# Patient Record
Sex: Male | Born: 2000
Health system: Southern US, Community
[De-identification: ages and names within clinical notes are randomized; demographics above are authoritative.]

## PROBLEM LIST (undated history)

## (undated) DIAGNOSIS — S83519A Sprain of anterior cruciate ligament of unspecified knee, initial encounter: Secondary | ICD-10-CM

## (undated) DIAGNOSIS — F411 Generalized anxiety disorder: Secondary | ICD-10-CM

## (undated) HISTORY — PX: TYMPANOSTOMY TUBE PLACEMENT: SHX32

## (undated) HISTORY — DX: Generalized anxiety disorder: F41.1

## (undated) HISTORY — PX: TEAR DUCT PROBING: SHX793

---
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
## 2018-04-18 DIAGNOSIS — Z113 Encounter for screening for infections with a predominantly sexual mode of transmission: Secondary | ICD-10-CM | POA: Diagnosis not present

## 2018-04-18 DIAGNOSIS — R369 Urethral discharge, unspecified: Secondary | ICD-10-CM | POA: Diagnosis not present

## 2018-04-18 DIAGNOSIS — Z114 Encounter for screening for human immunodeficiency virus [HIV]: Secondary | ICD-10-CM | POA: Diagnosis not present

## 2018-04-25 MED FILL — AZITHROMYCIN 500 MG TABLET: 500 | 1 days supply | Qty: 2 | Fill #0

## 2018-07-10 ENCOUNTER — Encounter: Payer: Self-pay | Admitting: Primary Care

## 2018-07-10 ENCOUNTER — Ambulatory Visit: Payer: BLUE CROSS/BLUE SHIELD | Admitting: Primary Care

## 2018-07-10 ENCOUNTER — Other Ambulatory Visit: Payer: Self-pay

## 2018-07-10 VITALS — BP 122/76 | HR 72 | Temp 98.4°F | Ht 69.0 in | Wt 129.0 lb

## 2018-07-10 DIAGNOSIS — F411 Generalized anxiety disorder: Secondary | ICD-10-CM | POA: Diagnosis not present

## 2018-07-10 DIAGNOSIS — Z8349 Family history of other endocrine, nutritional and metabolic diseases: Secondary | ICD-10-CM | POA: Diagnosis not present

## 2018-07-10 MED ORDER — ESCITALOPRAM OXALATE 10 MG PO TABS: 10.0000 mg | ORAL_TABLET | Freq: Every day | ORAL | 0 refills | Status: DC

## 2018-07-10 NOTE — Progress Notes (Signed)
Subjective:    Patient ID: Kristopher White, male    DOB: 05-23-2000, 18 y.o.   MRN: [id number]  HPI  Kristopher White is a 18 year old male who presents today to establish care and discuss the problems mentioned below. Will obtain/review records.  1) GAD: Originally diagnosed about month ago per the [facility] health [facility], but has struggled with anxiety for years. He was initiated on Lexapro 10 mg about 2-3 weeks ago and has since noticed a slight improvement in mood. No significant difference. He denies GI upset, nausea, SI/HI. He will run out of his Lexapro in 15 days and is needing refills.   2) Family History of Graves Disease: Present in his mother who was diagnosed at age 29. Following with endocrinology and was tested last month. He believes his thyroid function was on the "lower end of normal". He has a follow up visit scheduled for May 2020 at [facility]. He is not on medication. He denies heat intolerance, palpitations, chest pain, dizziness.   Review of Systems  Constitutional: Negative for unexpected weight change.  Respiratory: Negative for shortness of breath.   Cardiovascular: Negative for palpitations.  Endocrine: Negative for heat intolerance.  Psychiatric/Behavioral: Negative for suicidal ideas.       See HPI       Past Medical History:  Diagnosis Date  . ACL tear 07/02/2014   left  . GAD (generalized anxiety disorder)      Social History   Socioeconomic History  . Marital status: Single    Spouse name: Not on file  . Number of children: Not on file  . Years of education: Not on file  . Highest education level: Not on file  Occupational History  . Not on file  Social Needs  . Financial resource strain: Not on file  . Food insecurity:    Worry: Not on file    Inability: Not on file  . Transportation needs:    Medical: Not on file    Non-medical: Not on file  Tobacco Use  . Smoking status: Never Smoker  . Smokeless tobacco: Never Used  . Tobacco comment: Vaping   Substance and Sexual Activity  . Alcohol use: No  . Drug use: No  . Sexual activity: Not on file  Lifestyle  . Physical activity:    Days per week: Not on file    Minutes per session: Not on file  . Stress: Not on file  Relationships  . Social connections:    Talks on phone: Not on file    Gets together: Not on file    Attends religious service: Not on file    Active member of club or organization: Not on file    Attends meetings of clubs or organizations: Not on file    Relationship status: Not on file  . Intimate partner violence:    Fear of current or ex partner: Not on file    Emotionally abused: Not on file    Physically abused: Not on file    Forced sexual activity: Not on file  Other Topics Concern  . Not on file  Social History Narrative   Student at [employer].   Single.   Aspires to be a Best boy.    Enjoys playing lacrosse, swimming.     Past Surgical History:  Procedure Laterality Date  . KNEE ARTHROSCOPY WITH ANTERIOR CRUCIATE LIGAMENT (ACL) REPAIR Left 07/19/2014   Procedure: KNEE ARTHROSCOPY WITH ANTERIOR CRUCIATE LIGAMENT (ACL) REPAIR AUTOGRAFT;  Surgeon: Dannielle Huh, MD;  Location: [facility];  Service: Orthopedics;  Laterality: Left;  . TEAR DUCT PROBING    . TYMPANOSTOMY TUBE PLACEMENT      Family History  Problem Relation Age of Onset  . Hypertension Paternal Grandmother   . Melanoma Paternal Grandmother   . Graves' disease Mother   . Prostate cancer Paternal Grandfather   . Addison's disease Maternal Aunt     No Known Allergies  No current outpatient medications on file prior to visit.   No current facility-administered medications on file prior to visit.     BP 122/76   Pulse 72   Temp 98.4 F (36.9 C) (Oral)   Ht 5\' 9"  (1.753 m)   Wt 129 lb (58.5 kg)   SpO2 98%   BMI 19.05 kg/m    Objective:   Physical Exam  Constitutional: He appears well-nourished.  Neck: Neck supple.  Cardiovascular: Normal rate and regular  rhythm.  Respiratory: Effort normal and breath sounds normal.  Skin: Skin is warm and dry.  Psychiatric: He has a normal mood and affect.           Assessment & Plan:

## 2018-07-10 NOTE — Assessment & Plan Note (Addendum)
Chronic, initiated on treatment 2-3 weeks ago per [facility]. Refill provided for Lexapro 10 mg today. We will plan to see him back in 1 month for follow up once medication has had a longer time to take complete effect. He denies SI/HI.

## 2018-07-10 NOTE — Patient Instructions (Signed)
Continue escitalopram (Lexapro) 10 mg tablets for anxiety.   Please notify me if your appointment in May is cancelled with the endocrinologist.   Please schedule a follow up appointment in 1 month for re-evaluation of anxiety.  It was a pleasure to meet you today! Please don't hesitate to call or message me with any questions. Welcome to [facility]!

## 2018-07-10 NOTE — Assessment & Plan Note (Signed)
In mother.  Discussed for patient to notify me if his appointment with endocrinology is cancelled in May. If this is the case then we will check labs. Will obtain records in the mean time.

## 2018-07-13 MED FILL — ESCITALOPRAM 10 MG TABLET: 10 | 90 days supply | Qty: 90 | Fill #0

## 2018-08-13 ENCOUNTER — Ambulatory Visit (INDEPENDENT_AMBULATORY_CARE_PROVIDER_SITE_OTHER): Payer: BLUE CROSS/BLUE SHIELD | Admitting: Primary Care

## 2018-08-13 DIAGNOSIS — F411 Generalized anxiety disorder: Secondary | ICD-10-CM | POA: Diagnosis not present

## 2018-08-13 NOTE — Progress Notes (Signed)
Subjective:    Patient ID: Kristopher White, male    DOB: [birth year], 18 y.o.   MRN: [id number]  HPI  Virtual Visit via Video Note  I connected with Kristopher White on 08/13/18 at  9:20 AM EDT by a video enabled telemedicine application and verified that I am speaking with the correct person using two identifiers.   I discussed the limitations of evaluation and management by telemedicine and the availability of in person appointments. The patient expressed understanding and agreed to proceed. He is at home, I am in the office.  History of Present Illness:  Kristopher White is a 18 year old male who presents today for follow up of anxiety.  He was last evaluated on 07/10/18 as a new patient who was placed on Lexapro 10 mg 2-3 weeks ago by the student health center at [facility]. The university shut down due to Covid-19 so patient presented to [place] for refills. We continued his Lexapro 10 mg as he had only been taking for 2-3 weeks. He did endorse at that time that he hadn't noticed a significant improvement.  Since his last visit he's doing better. His appetite is still small overall but he's sleeping better. He's also noticed that he's not as upset with things that anger him, less irritability overall. He continues to worry about things but this has overall improved. He denies SI/HI.   Observations/Objective:  Mood appropriate, good eye contact, smiling. No distress. Speaking in complete sentences.  Assessment and Plan:  See problem based charting  Follow Up Instructions:  Continue Lexapro 10 mg tablets once daily for anxiety.   Have your pharmacy notify me when you need refills.  It was a pleasure to see you today! Kristopher White    I discussed the assessment and treatment plan with the patient. The patient was provided an opportunity to ask questions and all were answered. The patient agreed with the plan and demonstrated an understanding of the instructions.   The patient was advised to call  back or seek an in-person evaluation if the symptoms worsen or if the condition fails to improve as anticipated.    Kristopher White    Review of Systems  Respiratory: Negative for shortness of breath.   Cardiovascular: Negative for chest pain.  Psychiatric/Behavioral: Negative for sleep disturbance and suicidal ideas.       See HPI        Past Medical History:  Diagnosis Date  . ACL tear 07/02/2014   left  . GAD (generalized anxiety disorder)      Social History   Socioeconomic History  . Marital status: Single    Spouse name: Not on file  . Number of children: Not on file  . Years of education: Not on file  . Highest education level: Not on file  Occupational History  . Not on file  Social Needs  . Financial resource strain: Not on file  . Food insecurity:    Worry: Not on file    Inability: Not on file  . Transportation needs:    Medical: Not on file    Non-medical: Not on file  Tobacco Use  . Smoking status: Never Smoker  . Smokeless tobacco: Never Used  . Tobacco comment: Vaping  Substance and Sexual Activity  . Alcohol use: No  . Drug use: No  . Sexual activity: Not on file  Lifestyle  . Physical activity:    Days per week: Not on file  Minutes per session: Not on file  . Stress: Not on file  Relationships  . Social connections:    Talks on phone: Not on file    Gets together: Not on file    Attends religious service: Not on file    Active member of club or organization: Not on file    Attends meetings of clubs or organizations: Not on file    Relationship status: Not on file  . Intimate partner violence:    Fear of current or ex partner: Not on file    Emotionally abused: Not on file    Physically abused: Not on file    Forced sexual activity: Not on file  Other Topics Concern  . Not on file  Social History Narrative   Student at [employer].   Single.   Aspires to be a [employer] boys[employer] engineer.    Enjoys playing lacrosse, swimming.     Past  Surgical History:  Procedure Laterality Date  . KNEE ARTHROSCOPY WITH ANTERIOR CRUCIATE LIGAMENT (ACL) REPAIR Left 07/19/2014   Procedure: KNEE ARTHROSCOPY WITH ANTERIOR CRUCIATE LIGAMENT (ACL) REPAIR AUTOGRAFT;  Surgeon: Kristopher HuhSteve Lucey, MD;  Location: [facility];  Service: Orthopedics;  Laterality: Left;  . TEAR DUCT PROBING    . TYMPANOSTOMY TUBE PLACEMENT      Family History  Problem Relation Age of Onset  . Hypertension Paternal Grandmother   . Melanoma Paternal Grandmother   . Graves' disease Mother   . Prostate cancer Paternal Grandfather   . Addison's disease Maternal Aunt     No Known Allergies  Current Outpatient Medications on File Prior to Visit  Medication Sig Dispense Refill  . escitalopram (LEXAPRO) 10 MG tablet Take 1 tablet (10 mg total) by mouth daily. For anxiety. 90 tablet 0   No current facility-administered medications on file prior to visit.     There were no vitals taken for this visit.   Objective:   Physical Exam  Constitutional: He is oriented to person, place, and time. He appears well-nourished.  Respiratory: Effort normal.  Neurological: He is alert and oriented to person, place, and time.  Psychiatric: He has a normal mood and affect.           Assessment & Plan:

## 2018-08-13 NOTE — Patient Instructions (Signed)
Continue Lexapro 10 mg tablets once daily for anxiety.   Have your pharmacy notify me when you need refills.  It was a pleasure to see you today! Mayra Reel, NP-C

## 2018-08-13 NOTE — Assessment & Plan Note (Signed)
Improved and seems to be doing better overall on Lexapro 10 mg. No significant side effects. We agreed to continue him at his current dose and he will notify if anything changes in terms of symptoms. Denies SI/HI.

## 2018-09-22 ENCOUNTER — Ambulatory Visit (INDEPENDENT_AMBULATORY_CARE_PROVIDER_SITE_OTHER): Payer: BLUE CROSS/BLUE SHIELD | Admitting: Primary Care

## 2018-09-22 ENCOUNTER — Encounter: Payer: Self-pay | Admitting: Primary Care

## 2018-09-22 DIAGNOSIS — F411 Generalized anxiety disorder: Secondary | ICD-10-CM

## 2018-09-22 MED ORDER — ESCITALOPRAM OXALATE 20 MG PO TABS: 20.0000 mg | ORAL_TABLET | Freq: Every day | ORAL | 0 refills | Status: DC

## 2018-09-22 MED FILL — ESCITALOPRAM 20 MG TABLET: 20 | 90 days supply | Qty: 90 | Fill #0

## 2018-09-22 NOTE — Assessment & Plan Note (Signed)
Overall responding well to Lexapro but do see where he may benefit from a dose increase in order to further reduce symptoms. Discussed potential effects of this including drowsiness. Rx for Lexapro 20 mg sent to pharmacy. He will update in one month.

## 2018-09-22 NOTE — Progress Notes (Signed)
Subjective:    Patient ID: Kristopher White, male    DOB: 02-26-2001, 18 y.o.   MRN: [id number]  HPI  Virtual Visit via Video Note  I connected with Kristopher White on 09/22/18 at  3:20 PM EDT by a video enabled telemedicine application and verified that I am speaking with the correct person using two identifiers.  Location: Patient: Parking lot Provider: Office   I discussed the limitations of evaluation and management by telemedicine and the availability of in person appointments. The patient expressed understanding and agreed to proceed.  History of Present Illness:  Mr. Kristopher White is a 18 year old male who presents today for follow up of anxiety.  He was last evaluated on 08/13/18 for follow up of anxiety, endorsed doing better on Lexapro 10 mg from when it was originally prescribed by an [employer] professional so it was continued.    Since his last visit he continues to notice improvement in sleep and is eating much better. He's also noticed a decrease in overall irritability but still continues to notice irritability. He continues to feel anxious daily but less intensity overall. He is interested in a dose increase of his medication to see if this helps with symptoms. Denies SI/HI. GAD 7 score of 12 today.    Observations/Objective:  Alert and oriented. Appears well, not sickly. No distress. Speaking in complete sentences.   Assessment and Plan:  Overall responding well to Lexapro but do see where he may benefit from a dose increase in order to further reduce symptoms. Discussed potential effects of this including drowsiness. Rx for Lexapro 20 mg sent to pharmacy. He will update in one month.  Follow Up Instructions:  We've increased your dose of Lexapro to 20 mg. You may take two of the 10 mg tablets to equal 20 mg until your bottle is empty.  Please update me in one month as discussed.   It was a pleasure to see you today! Mayra Reel, NP-C    I discussed the assessment  and treatment plan with the patient. The patient was provided an opportunity to ask questions and all were answered. The patient agreed with the plan and demonstrated an understanding of the instructions.   The patient was advised to call back or seek an in-person evaluation if the symptoms worsen or if the condition fails to improve as anticipated.     Doreene Nest, NP    Review of Systems  Respiratory: Negative for shortness of breath.   Cardiovascular: Negative for chest pain.  Gastrointestinal: Negative for nausea.  Neurological: Negative for dizziness and headaches.  Psychiatric/Behavioral:       See HPI       Past Medical History:  Diagnosis Date  . ACL tear 07/02/2014   left  . GAD (generalized anxiety disorder)      Social History   Socioeconomic History  . Marital status: Single    Spouse name: Not on file  . Number of children: Not on file  . Years of education: Not on file  . Highest education level: Not on file  Occupational History  . Not on file  Social Needs  . Financial resource strain: Not on file  . Food insecurity:    Worry: Not on file    Inability: Not on file  . Transportation needs:    Medical: Not on file    Non-medical: Not on file  Tobacco Use  . Smoking status: Never Smoker  . Smokeless tobacco: Never  Used  . Tobacco comment: Vaping  Substance and Sexual Activity  . Alcohol use: No  . Drug use: No  . Sexual activity: Not on file  Lifestyle  . Physical activity:    Days per week: Not on file    Minutes per session: Not on file  . Stress: Not on file  Relationships  . Social connections:    Talks on phone: Not on file    Gets together: Not on file    Attends religious service: Not on file    Active member of club or organization: Not on file    Attends meetings of clubs or organizations: Not on file    Relationship status: Not on file  . Intimate partner violence:    Fear of current or ex partner: Not on file     Emotionally abused: Not on file    Physically abused: Not on file    Forced sexual activity: Not on file  Other Topics Concern  . Not on file  Social History Narrative   Student at [employer].   Single.   Aspires to be a [employer] boys[employer] engineer.    Enjoys playing lacrosse, swimming.     Past Surgical History:  Procedure Laterality Date  . KNEE ARTHROSCOPY WITH ANTERIOR CRUCIATE LIGAMENT (ACL) REPAIR Left 07/19/2014   Procedure: KNEE ARTHROSCOPY WITH ANTERIOR CRUCIATE LIGAMENT (ACL) REPAIR AUTOGRAFT;  Surgeon: Dannielle HuhSteve Lucey, MD;  Location: [facility];  Service: Orthopedics;  Laterality: Left;  . TEAR DUCT PROBING    . TYMPANOSTOMY TUBE PLACEMENT      Family History  Problem Relation Age of Onset  . Hypertension Paternal Grandmother   . Melanoma Paternal Grandmother   . Graves' disease Mother   . Prostate cancer Paternal Grandfather   . Addison's disease Maternal Aunt     No Known Allergies  Current Outpatient Medications on File Prior to Visit  Medication Sig Dispense Refill  . escitalopram (LEXAPRO) 10 MG tablet Take 1 tablet (10 mg total) by mouth daily. For anxiety. 90 tablet 0   No current facility-administered medications on file prior to visit.     There were no vitals taken for this visit.   Objective:   Physical Exam  Constitutional: He is oriented to person, place, and time. He appears well-nourished.  Respiratory: Effort normal.  Neurological: He is alert and oriented to person, place, and time.  Psychiatric: He has a normal mood and affect.           Assessment & Plan:

## 2018-09-22 NOTE — Patient Instructions (Signed)
We've increased your dose of Lexapro to 20 mg. You may take two of the 10 mg tablets to equal 20 mg until your bottle is empty.  Please update me in one month as discussed.   It was a pleasure to see you today! Mayra Reel, NP-C

## 2018-10-10 MED FILL — ESCITALOPRAM 20 MG TABLET: 20 | 90 days supply | Qty: 90 | Fill #0

## 2019-04-13 ENCOUNTER — Ambulatory Visit (INDEPENDENT_AMBULATORY_CARE_PROVIDER_SITE_OTHER): Payer: BC Managed Care – PPO | Admitting: Primary Care

## 2019-04-13 DIAGNOSIS — R634 Abnormal weight loss: Secondary | ICD-10-CM

## 2019-04-13 DIAGNOSIS — R194 Change in bowel habit: Secondary | ICD-10-CM | POA: Diagnosis not present

## 2019-04-13 HISTORY — DX: Abnormal weight loss: R63.4

## 2019-04-13 NOTE — Progress Notes (Signed)
Subjective:    Patient ID: Kristopher White, male    DOB: 11/28/2000, 18 y.o.   MRN: [id number]  HPI  Virtual Visit via Video Note  I connected with Kristopher White on 04/13/19 at  2:00 PM EST by a video enabled telemedicine application and verified that I am speaking with the correct person using two identifiers.  Location: Patient: Home Provider: Office   I discussed the limitations of evaluation and management by telemedicine and the availability of in person appointments. The patient expressed understanding and agreed to proceed.  History of Present Illness:  [name] is a 18 year old male with a history of anxiety disorder who presents today   Nearly each time he eats a meal he will have to run to the bathroom to have a bowel movement. This occurs within 10 minutes of eating and occurs daily, this began about one year ago and is about the same. These stools are formed and soft, not watery or loose. He endorses losing 20 pounds within the last two years, has difficulty gaining weight despite eating numerous and large meals. He is not active with sports/exercise. He does have epigastric discomfort just prior to meals, feels like my stomach is "gnawing at itself" when it's hungry. Occasional nausea when he feels hungry.   He denies bloody stools, diarrhea, changes in appetite, recent antibiotic use, regular heartburn, vomiting. He is having bowel movements 3-4 times daily. His appetite has improved greatly since treatment with Lexapro.  Diet currently consists of:  Breakfast: Skips sometimes, fast food Lunch: chicken, vegetables (corn, green beans) Dinner: meat, starch, rice, vegetables  Snacks: Cereal, fruit, smoothies Beverages: Sweet tea, water, milk, juice, some soda     Observations/Objective:  Alert and oriented. Appears well, not sickly. No distress. Speaking in complete sentences.   Assessment and Plan:  Seems to have hyperactive bowels with regular movements during  meals, not really having pain during meals or after.  He does have a family history of thyroid disease.  Check labs first including CMP, CBC, TSH. Consider ultrasound of the gall bladder, but less likely the cause given lack of pain and diarrhea. Consider GI evaluation if needed. Weight loss is concerning. Await results.   Wt Readings from Last 3 Encounters:  07/10/18 129 lb (58.5 kg) (11 %, Z= -1.20)*  06/05/16 138 lb (62.6 kg) (39 %, Z= -0.28)*  06/18/15 143 lb (64.9 kg) (59 %, Z= 0.23)*   * Growth percentiles are based on CDC (Boys, 2-20 Years) data.     Follow Up Instructions:  Call the main office to schedule the lab appointment.  It was a pleasure to see you today! Mayra Reel, NP-C    I discussed the assessment and treatment plan with the patient. The patient was provided an opportunity to ask questions and all were answered. The patient agreed with the plan and demonstrated an understanding of the instructions.   The patient was advised to call back or seek an in-person evaluation if the symptoms worsen or if the condition fails to improve as anticipated.    Doreene Nest, NP    Review of Systems  Constitutional: Positive for unexpected weight change. Negative for fever.  Gastrointestinal: Positive for nausea. Negative for abdominal pain, blood in stool, constipation, diarrhea and vomiting.       Abdominal discomfort when feeling hungry  Genitourinary: Negative for difficulty urinating.       Past Medical History:  Diagnosis Date  . ACL tear 07/02/2014  left  . GAD (generalized anxiety disorder)      Social History   Socioeconomic History  . Marital status: Single    Spouse name: Not on file  . Number of children: Not on file  . Years of education: Not on file  . Highest education level: Not on file  Occupational History  . Not on file  Tobacco Use  . Smoking status: Never Smoker  . Smokeless tobacco: Never Used  . Tobacco comment: Vaping    Substance and Sexual Activity  . Alcohol use: No  . Drug use: No  . Sexual activity: Not on file  Other Topics Concern  . Not on file  Social History Narrative   Student at [employer].   Single.   Aspires to be a Psychologist, sport and exercise.    Enjoys playing lacrosse, swimming.    Social Determinants of Health   Financial Resource Strain:   . Difficulty of Paying Living Expenses: Not on file  Food Insecurity:   . Worried About Charity fundraiser in the Last Year: Not on file  . Ran Out of Food in the Last Year: Not on file  Transportation Needs:   . Lack of Transportation (Medical): Not on file  . Lack of Transportation (Non-Medical): Not on file  Physical Activity:   . Days of Exercise per Week: Not on file  . Minutes of Exercise per Session: Not on file  Stress:   . Feeling of Stress : Not on file  Social Connections:   . Frequency of Communication with Friends and Family: Not on file  . Frequency of Social Gatherings with Friends and Family: Not on file  . Attends Religious Services: Not on file  . Active Member of Clubs or Organizations: Not on file  . Attends Archivist Meetings: Not on file  . Marital Status: Not on file  Intimate Partner Violence:   . Fear of Current or Ex-Partner: Not on file  . Emotionally Abused: Not on file  . Physically Abused: Not on file  . Sexually Abused: Not on file    Past Surgical History:  Procedure Laterality Date  . KNEE ARTHROSCOPY WITH ANTERIOR CRUCIATE LIGAMENT (ACL) REPAIR Left 07/19/2014   Procedure: KNEE ARTHROSCOPY WITH ANTERIOR CRUCIATE LIGAMENT (ACL) REPAIR AUTOGRAFT;  Surgeon: Vickey Huger, MD;  Location: [facility];  Service: Orthopedics;  Laterality: Left;  . TEAR DUCT PROBING    . TYMPANOSTOMY TUBE PLACEMENT      Family History  Problem Relation Age of Onset  . Hypertension Paternal Grandmother   . Melanoma Paternal Grandmother   . Graves' disease Mother   . Prostate cancer Paternal Grandfather   .  Addison's disease Maternal Aunt     No Known Allergies  Current Outpatient Medications on File Prior to Visit  Medication Sig Dispense Refill  . escitalopram (LEXAPRO) 20 MG tablet Take 1 tablet (20 mg total) by mouth daily. For anxiety. 90 tablet 0   No current facility-administered medications on file prior to visit.    There were no vitals taken for this visit.   Objective:   Physical Exam  Constitutional: He is oriented to person, place, and time. He appears well-nourished.  Respiratory: Effort normal.  Neurological: He is alert and oriented to person, place, and time.  Psychiatric: He has a normal mood and affect.           Assessment & Plan:

## 2019-04-13 NOTE — Patient Instructions (Signed)
Call the main office to schedule the lab appointment.  It was a pleasure to see you today! Allie Bossier, NP-C

## 2019-04-13 NOTE — Assessment & Plan Note (Signed)
Seems to have hyperactive bowels with regular movements during meals, not really having pain during meals or after.  He does have a family history of thyroid disease.  Check labs first including CMP, CBC, TSH. Consider ultrasound of the gall bladder, but less likely the cause given lack of pain and diarrhea. Consider GI evaluation if needed. Weight loss is concerning. Await results.

## 2019-04-13 NOTE — Assessment & Plan Note (Signed)
Seems to have hyperactive bowels with regular movements during meals, not really having pain during meals or after.  He does have a family history of thyroid disease.  Check labs first including CMP, CBC, TSH. Consider ultrasound of the gall bladder, but less likely the cause given lack of pain and diarrhea. Consider GI evaluation if needed. Weight loss is concerning. Await results.  

## 2019-04-14 ENCOUNTER — Other Ambulatory Visit: Payer: BC Managed Care – PPO

## 2019-04-14 ENCOUNTER — Other Ambulatory Visit: Payer: Self-pay | Admitting: Primary Care

## 2019-04-14 ENCOUNTER — Other Ambulatory Visit: Payer: Self-pay

## 2019-04-14 DIAGNOSIS — R634 Abnormal weight loss: Secondary | ICD-10-CM

## 2019-04-15 LAB — COMPREHENSIVE METABOLIC PANEL
ALT: 20 IU/L (ref 0–44)
AST: 25 IU/L (ref 0–40)
Albumin/Globulin Ratio: 2.6 — ABNORMAL HIGH (ref 1.2–2.2)
Albumin: 5.7 g/dL — ABNORMAL HIGH (ref 4.1–5.2)
Alkaline Phosphatase: 115 IU/L (ref 39–117)
BUN/Creatinine Ratio: 9 (ref 9–20)
BUN: 10 mg/dL (ref 6–20)
Bilirubin Total: 0.9 mg/dL (ref 0.0–1.2)
CO2: 22 mmol/L (ref 20–29)
Calcium: 10.4 mg/dL — ABNORMAL HIGH (ref 8.7–10.2)
Chloride: 98 mmol/L (ref 96–106)
Creatinine, Ser: 1.16 mg/dL (ref 0.76–1.27)
GFR calc Af Amer: 104 mL/min/{1.73_m2} (ref >59)
GFR calc non Af Amer: 90 mL/min/{1.73_m2} (ref >59)
Globulin, Total: 2.2 g/dL (ref 1.5–4.5)
Glucose: 93 mg/dL (ref 65–99)
Potassium: 4.1 mmol/L (ref 3.5–5.2)
Sodium: 141 mmol/L (ref 134–144)
Total Protein: 7.9 g/dL (ref 6.0–8.5)

## 2019-04-15 LAB — CBC WITH DIFFERENTIAL/PLATELET
Basophils Absolute: 0 10*3/uL (ref 0.0–0.2)
Basos: 0 %
EOS (ABSOLUTE): 0.1 10*3/uL (ref 0.0–0.4)
Eos: 1 %
Hematocrit: 50.8 % (ref 37.5–51.0)
Hemoglobin: 17.4 g/dL (ref 13.0–17.7)
Immature Grans (Abs): 0 10*3/uL (ref 0.0–0.1)
Immature Granulocytes: 0 %
Lymphocytes Absolute: 2.2 10*3/uL (ref 0.7–3.1)
Lymphs: 40 %
MCH: 31.6 pg (ref 26.6–33.0)
MCHC: 34.3 g/dL (ref 31.5–35.7)
MCV: 92 fL (ref 79–97)
Monocytes Absolute: 0.5 10*3/uL (ref 0.1–0.9)
Monocytes: 9 %
Neutrophils Absolute: 2.8 10*3/uL (ref 1.4–7.0)
Neutrophils: 50 %
Platelets: 217 10*3/uL (ref 150–450)
RBC: 5.5 x10E6/uL (ref 4.14–5.80)
RDW: 12.5 % (ref 11.6–15.4)
WBC: 5.6 10*3/uL (ref 3.4–10.8)

## 2019-04-15 LAB — HIV ANTIBODY (ROUTINE TESTING W REFLEX): HIV Screen 4th Generation wRfx: NONREACTIVE

## 2019-04-15 LAB — TSH: TSH: 1.65 u[IU]/mL (ref 0.450–4.500)

## 2019-04-27 ENCOUNTER — Other Ambulatory Visit: Payer: Self-pay | Admitting: Primary Care

## 2019-04-27 ENCOUNTER — Telehealth: Payer: Self-pay

## 2019-04-27 DIAGNOSIS — R194 Change in bowel habit: Secondary | ICD-10-CM

## 2019-04-27 NOTE — Telephone Encounter (Signed)
Error

## 2019-05-13 ENCOUNTER — Encounter: Payer: Self-pay | Admitting: Gastroenterology

## 2019-07-03 ENCOUNTER — Telehealth: Payer: Self-pay

## 2019-07-03 NOTE — Telephone Encounter (Signed)
Referral closed.  See Referral Notes/hx.  Pt has not responded to phone calls or Unable to Contact letter.  

## 2019-07-04 NOTE — Telephone Encounter (Signed)
Noted  

## 2020-03-03 ENCOUNTER — Ambulatory Visit (INDEPENDENT_AMBULATORY_CARE_PROVIDER_SITE_OTHER): Payer: BC Managed Care – PPO | Admitting: *Deleted

## 2020-03-03 ENCOUNTER — Other Ambulatory Visit: Payer: Self-pay

## 2020-03-03 DIAGNOSIS — Z23 Encounter for immunization: Secondary | ICD-10-CM | POA: Diagnosis not present

## 2020-03-03 NOTE — Progress Notes (Signed)
Pt here for flu vaccine, pt tolerated injection well.

## 2020-03-05 NOTE — Progress Notes (Signed)
Patient was assessed and managed by nursing staff during this encounter. I have reviewed the chart and agree with the documentation and plan. I have also made any necessary editorial changes.  Yishai Rehfeld, MD 03/05/2020 11:15 PM   

## 2020-12-12 ENCOUNTER — Ambulatory Visit: Payer: No Typology Code available for payment source | Admitting: Family Medicine

## 2020-12-12 ENCOUNTER — Encounter: Payer: Self-pay | Admitting: Family Medicine

## 2020-12-12 ENCOUNTER — Other Ambulatory Visit: Payer: Self-pay

## 2020-12-12 VITALS — BP 110/80 | HR 69 | Temp 98.1°F | Resp 14 | Ht 69.0 in | Wt 149.8 lb

## 2020-12-12 DIAGNOSIS — S161XXA Strain of muscle, fascia and tendon at neck level, initial encounter: Secondary | ICD-10-CM | POA: Diagnosis not present

## 2020-12-12 HISTORY — DX: Strain of muscle, fascia and tendon at neck level, initial encounter: S16.1XXA

## 2020-12-12 NOTE — Assessment & Plan Note (Signed)
Home stretches for side and neck. Return 1 month if no improvement for consideration for CXR given vaping hx and worse with deep breathing.

## 2020-12-12 NOTE — Progress Notes (Signed)
Subjective:     Kristopher White is a 20 y.o. male presenting for Acute Visit (Pt stated that for about 1 1/2 months he has been having pain in his right side when taking a deep breath and yawning. He stated that the pain starts in the right side of his neck and radiates down into his chest, and under his arm. Pt has not done anything to injury it he recalls. Pt does vape. )     Back Pain This is a new problem. The current episode started more than 1 month ago. The problem occurs intermittently. The pain is present in the thoracic spine (neck, shoulder). Radiates to: neck and shoulder. The pain is moderate. Exacerbated by: breathing. Associated symptoms include numbness (on the right side, skin). Pertinent negatives include no chest pain, fever, headaches, tingling or weakness. He has tried nothing for the symptoms.    Job - works for his [employer], also computer work  Does vape - no change in symptoms, no chronic cough or sob  Symptoms are quick on and off - lasts for 1 minute, occurs 3-4 times a day  Review of Systems  Constitutional:  Negative for chills, fatigue, fever and unexpected weight change.  Cardiovascular:  Negative for chest pain.  Musculoskeletal:  Positive for back pain.  Neurological:  Positive for numbness (on the right side, skin). Negative for tingling, weakness and headaches.    Social History   Tobacco Use  Smoking Status Never  Smokeless Tobacco Never  Tobacco Comments   Vaping        Objective:    BP Readings from Last 3 Encounters:  12/12/20 110/80  07/10/18 122/76  06/05/16 135/93 (93 %, Z = 1.48 /  99 %, Z = 2.33)*   *BP percentiles are based on the 2017 AAP Clinical Practice Guideline for boys   Wt Readings from Last 3 Encounters:  12/12/20 149 lb 12.8 oz (67.9 kg)  07/10/18 129 lb (58.5 kg) (11 %, Z= -1.20)*  06/05/16 138 lb (62.6 kg) (39 %, Z= -0.28)*   * Growth percentiles are based on CDC (Boys, 2-20 Years) data.     BP 110/80 (BP Location: Right Arm, Patient Position: Sitting, Cuff Size: Normal)   Pulse 69   Temp 98.1 F (36.7 C) (Temporal)   Resp 14   Ht 5\' 9"  (1.753 m)   Wt 149 lb 12.8 oz (67.9 kg)   SpO2 98%   BMI 22.12 kg/m    Physical Exam Constitutional:      Appearance: Normal appearance. He is not ill-appearing or diaphoretic.  HENT:     Right Ear: External ear normal.     Left Ear: External ear normal.  Eyes:     General: No scleral icterus.    Extraocular Movements: Extraocular movements intact.     Conjunctiva/sclera: Conjunctivae normal.  Cardiovascular:     Rate and Rhythm: Normal rate and regular rhythm.     Heart sounds: No murmur heard. Pulmonary:     Effort: Pulmonary effort is normal. No respiratory distress.     Breath sounds: Normal breath sounds. No wheezing.  Chest:     Comments: Some ttp along the right lower rib cage. Tight sensation with side stretch Musculoskeletal:     Cervical back: Normal range of motion and neck supple. No rigidity. Pain with movement (Right side with looking up triggers tightness symptoms) and muscular tenderness (right trapezius area) present. No spinous process tenderness.     Comments: Back:  No stepoffs or abnormal findings No spinous or paraspinous ttp ROM normal  Skin:    General: Skin is warm and dry.  Neurological:     Mental Status: He is alert. Mental status is at baseline.  Psychiatric:        Mood and Affect: Mood normal.          Assessment & Plan:   Problem List Items Addressed This Visit       Musculoskeletal and Integument   Cervical strain - Primary    Home stretches for side and neck. Return 1 month if no improvement for consideration for CXR given vaping hx and worse with deep breathing.         Return if symptoms worsen or fail to improve.  Lynnda Child, MD  This visit occurred during the SARS-CoV-2 public health emergency.  Safety protocols were in place, including screening questions  prior to the visit, additional usage of staff PPE, and extensive cleaning of exam room while observing appropriate contact time as indicated for disinfecting solutions.

## 2020-12-12 NOTE — Patient Instructions (Signed)
Neck stretches - hand out  Then do the side stretch for the right side - try to do 20-30 seconds, 3-5 times a day  See Jae Dire if your symptoms do not improve

## 2021-02-21 ENCOUNTER — Other Ambulatory Visit: Payer: Self-pay

## 2021-02-21 ENCOUNTER — Ambulatory Visit (INDEPENDENT_AMBULATORY_CARE_PROVIDER_SITE_OTHER): Payer: No Typology Code available for payment source

## 2021-02-21 DIAGNOSIS — Z23 Encounter for immunization: Secondary | ICD-10-CM | POA: Diagnosis not present

## 2021-02-21 NOTE — Progress Notes (Signed)
Pt here for Tdap and Flu vaccine. Pt tolerated injections well.

## 2022-03-11 ENCOUNTER — Telehealth: Payer: No Typology Code available for payment source | Admitting: Family

## 2022-03-11 DIAGNOSIS — Z8619 Personal history of other infectious and parasitic diseases: Secondary | ICD-10-CM

## 2022-03-11 DIAGNOSIS — H6690 Otitis media, unspecified, unspecified ear: Secondary | ICD-10-CM | POA: Diagnosis not present

## 2022-03-11 DIAGNOSIS — J019 Acute sinusitis, unspecified: Secondary | ICD-10-CM | POA: Diagnosis not present

## 2022-03-11 MED ORDER — AMOXICILLIN-POT CLAVULANATE 875-125 MG PO TABS: 1.0000 | ORAL_TABLET | Freq: Two times a day (BID) | ORAL | 0 refills | Status: DC

## 2022-03-11 NOTE — Progress Notes (Signed)
Virtual Visit Consent   Kristopher White, you are scheduled for a virtual visit with a [facility] provider today. Just as with appointments in the office, your consent must be obtained to participate. Your consent will be active for this visit and any virtual visit you may have with one of our providers in the next 365 days. If you have a MyChart account, a copy of this consent can be sent to you electronically.  As this is a virtual visit, video technology does not allow for your provider to perform a traditional examination. This may limit your provider's ability to fully assess your condition. If your provider identifies any concerns that need to be evaluated in person or the need to arrange testing (such as labs, EKG, etc.), we will make arrangements to do so. Although advances in technology are sophisticated, we cannot ensure that it will always work on either your end or our end. If the connection with a video visit is poor, the visit may have to be switched to a telephone visit. With either a video or telephone visit, we are not always able to ensure that we have a secure connection.  By engaging in this virtual visit, you consent to the provision of healthcare and authorize for your insurance to be billed (if applicable) for the services provided during this visit. Depending on your insurance coverage, you may receive a charge related to this service.  I need to obtain your verbal consent now. Are you willing to proceed with your visit today? Kristopher White has provided verbal consent on 03/11/2022 for a virtual visit (video or telephone). Evelina Dun, FNP  Date: 03/11/2022 2:57 PM  Virtual Visit via Video Note   I, Evelina Dun, connected with  Kristopher White  ([id number], [birth year]) on 03/11/22 at  2:45 PM EST by a video-enabled telemedicine application and verified that I am speaking with the correct person using two identifiers.  Location: Patient: Virtual Visit Location Patient:  Home Provider: Virtual Visit Location Provider: Home Office   I discussed the limitations of evaluation and management by telemedicine and the availability of in person appointments. The patient expressed understanding and agreed to proceed.    History of Present Illness: Kristopher White is a 21 y.o. who identifies as a male who was assigned male at birth, and is being seen today for left ear pain and congestion. He reports he had RSV last week and was improving. However, over the last few days he has started feeling worse with cough and left ear pain.   HPI: Otalgia  There is pain in the left ear. This is a new problem. The current episode started in the past 7 days. The problem occurs constantly. The problem has been waxing and waning. There has been no fever. The pain is at a severity of 6/10. The pain is moderate. Associated symptoms include coughing, hearing loss and rhinorrhea. Pertinent negatives include no sore throat or vomiting. He has tried acetaminophen for the symptoms. The treatment provided mild relief.  Cough This is a recurrent problem. The current episode started 1 to 4 weeks ago. The problem occurs every few minutes. The cough is Productive of sputum. Associated symptoms include ear pain, nasal congestion, postnasal drip and rhinorrhea. Pertinent negatives include no sore throat.    Problems:  Patient Active Problem List   Diagnosis Date Noted   Cervical strain 12/12/2020   Frequent bowel movements 04/13/2019   Weight loss, unintentional 04/13/2019   GAD (generalized anxiety disorder) 07/10/2018  Family history of Graves' disease 07/10/2018    Allergies: No Known Allergies Medications:  Current Outpatient Medications:    amoxicillin-clavulanate (AUGMENTIN) 875-125 MG tablet, Take 1 tablet by mouth 2 (two) times daily., Disp: 14 tablet, Rfl: 0   escitalopram (LEXAPRO) 20 MG tablet, Take 1 tablet (20 mg total) by mouth daily. For anxiety. (Patient not taking: Reported on  12/12/2020), Disp: 90 tablet, Rfl: 0  Observations/Objective: Patient is well-developed, well-nourished in no acute distress.  Resting comfortably  at home.  Head is normocephalic, atraumatic.  No labored breathing.  Speech is clear and coherent with logical content.  Patient is alert and oriented at baseline.  Nasal congestion  Assessment and Plan: 1. Acute otitis media, unspecified otitis media type - amoxicillin-clavulanate (AUGMENTIN) 875-125 MG tablet; Take 1 tablet by mouth 2 (two) times daily.  Dispense: 14 tablet; Refill: 0  2. History of RSV infection - amoxicillin-clavulanate (AUGMENTIN) 875-125 MG tablet; Take 1 tablet by mouth 2 (two) times daily.  Dispense: 14 tablet; Refill: 0  3. Acute sinusitis, recurrence not specified, unspecified location - amoxicillin-clavulanate (AUGMENTIN) 875-125 MG tablet; Take 1 tablet by mouth 2 (two) times daily.  Dispense: 14 tablet; Refill: 0  - Take meds as prescribed - Use a cool mist humidifier  -Use saline nose sprays frequently -Force fluids -For any cough or congestion  Use plain Mucinex- regular strength or max strength is fine -For fever or aces or pains- take tylenol or ibuprofen. -Throat lozenges if helps -Follow up if symptoms worsen or do not improve   Follow Up Instructions: I discussed the assessment and treatment plan with the patient. The patient was provided an opportunity to ask questions and all were answered. The patient agreed with the plan and demonstrated an understanding of the instructions.  A copy of instructions were sent to the patient via MyChart unless otherwise noted below.    The patient was advised to call back or seek an in-person evaluation if the symptoms worsen or if the condition fails to improve as anticipated.  Time:  I spent 6 minutes with the patient via telehealth technology discussing the above problems/concerns.    Jannifer Rodney, FNP

## 2022-04-02 ENCOUNTER — Encounter: Payer: Self-pay | Admitting: Primary Care

## 2022-04-02 ENCOUNTER — Ambulatory Visit (INDEPENDENT_AMBULATORY_CARE_PROVIDER_SITE_OTHER): Payer: No Typology Code available for payment source | Admitting: Primary Care

## 2022-04-02 ENCOUNTER — Encounter: Payer: No Typology Code available for payment source | Admitting: Primary Care

## 2022-04-02 VITALS — BP 138/80 | HR 75 | Temp 100.0°F | Ht 69.0 in | Wt 144.0 lb

## 2022-04-02 DIAGNOSIS — N3943 Post-void dribbling: Secondary | ICD-10-CM | POA: Diagnosis not present

## 2022-04-02 DIAGNOSIS — Z227 Latent tuberculosis: Secondary | ICD-10-CM | POA: Diagnosis not present

## 2022-04-02 DIAGNOSIS — Z0001 Encounter for general adult medical examination with abnormal findings: Secondary | ICD-10-CM

## 2022-04-02 DIAGNOSIS — F411 Generalized anxiety disorder: Secondary | ICD-10-CM

## 2022-04-02 LAB — POC URINALSYSI DIPSTICK (AUTOMATED)
Bilirubin, UA: NEGATIVE
Blood, UA: NEGATIVE
Glucose, UA: NEGATIVE
Ketones, UA: NEGATIVE
Leukocytes, UA: NEGATIVE
Nitrite, UA: NEGATIVE
Protein, UA: POSITIVE — AB
Spec Grav, UA: 1.01 (ref 1.010–1.025)
Urobilinogen, UA: 0.2 E.U./dL
pH, UA: 7 (ref 5.0–8.0)

## 2022-04-02 NOTE — Patient Instructions (Signed)
Return as discussed for your TB skin read.  We will be in touch once we receive your urine test results.  It was a pleasure to see you today!  Preventive Care 88-23 Years Old, Male Preventive care refers to lifestyle choices and visits with your health care provider that can promote health and wellness. Preventive care visits are also called wellness exams. What can I expect for my preventive care visit? Counseling During your preventive care visit, your health care provider may ask about your: Medical history, including: Past medical problems. Family medical history. Current health, including: Emotional well-being. Home life and relationship well-being. Sexual activity. Lifestyle, including: Alcohol, nicotine or tobacco, and drug use. Access to firearms. Diet, exercise, and sleep habits. Safety issues such as seatbelt and bike helmet use. Sunscreen use. Work and work Astronomer. Physical exam Your health care provider may check your: Height and weight. These may be used to calculate your BMI (body mass index). BMI is a measurement that tells if you are at a healthy weight. Waist circumference. This measures the distance around your waistline. This measurement also tells if you are at a healthy weight and may help predict your risk of certain diseases, such as type 2 diabetes and high blood pressure. Heart rate and blood pressure. Body temperature. Skin for abnormal spots. What immunizations do I need?  Vaccines are usually given at various ages, according to a schedule. Your health care provider will recommend vaccines for you based on your age, medical history, and lifestyle or other factors, such as travel or where you work. What tests do I need? Screening Your health care provider may recommend screening tests for certain conditions. This may include: Lipid and cholesterol levels. Diabetes screening. This is done by checking your blood sugar (glucose) after you have not  eaten for a while (fasting). Hepatitis B test. Hepatitis C test. HIV (human immunodeficiency virus) test. STI (sexually transmitted infection) testing, if you are at risk. Talk with your health care provider about your test results, treatment options, and if necessary, the need for more tests. Follow these instructions at home: Eating and drinking  Eat a healthy diet that includes fresh fruits and vegetables, whole grains, lean protein, and low-fat dairy products. Drink enough fluid to keep your urine pale yellow. Take vitamin and mineral supplements as recommended by your health care provider. Do not drink alcohol if your health care provider tells you not to drink. If you drink alcohol: Limit how much you have to 0-2 drinks a day. Know how much alcohol is in your drink. In the [place] one drink equals one 12 oz bottle of beer (355 mL), one 5 oz glass of wine (148 mL), or one 1 oz glass of hard liquor (44 mL). Lifestyle Brush your teeth every morning and night with fluoride toothpaste. Floss one time each day. Exercise for at least 30 minutes 5 or more days each week. Do not use any products that contain nicotine or tobacco. These products include cigarettes, chewing tobacco, and vaping devices, such as e-cigarettes. If you need help quitting, ask your health care provider. Do not use drugs. If you are sexually active, practice safe sex. Use a condom or other form of protection to prevent STIs. Find healthy ways to manage stress, such as: Meditation, yoga, or listening to music. Journaling. Talking to a trusted person. Spending time with friends and family. Minimize exposure to UV radiation to reduce your risk of skin cancer. Safety Always wear your seat belt while driving  or riding in a vehicle. Do not drive: If you have been drinking alcohol. Do not ride with someone who has been drinking. If you have been using any mind-altering substances or drugs. While texting. When you are  tired or distracted. Wear a helmet and other protective equipment during sports activities. If you have firearms in your house, make sure you follow all gun safety procedures. Seek help if you have been physically or sexually abused. What's next? Go to your health care provider once a year for an annual wellness visit. Ask your health care provider how often you should have your eyes and teeth checked. Stay up to date on all vaccines. This information is not intended to replace advice given to you by your health care provider. Make sure you discuss any questions you have with your health care provider. Document Revised: 10/05/2020 Document Reviewed: 10/05/2020 Elsevier Patient Education  2023 [employer].

## 2022-04-02 NOTE — Assessment & Plan Note (Signed)
Tetanus and HPV up-to-date, declines influenza vaccine today.  Encouraged healthy diet and regular exercise.  Exam today stable. Lab results pending.  Follow-up in 1 year for CPE.

## 2022-04-02 NOTE — Progress Notes (Signed)
Subjective:    Patient ID: Kristopher White, male    DOB: December 23, 2000, 23 y.o.   MRN: [id number]  HPI  Kristopher White is a very pleasant 21 y.o. male who presents today for complete physical and follow up of chronic conditions.  He would also like to discuss post-void dribbling. Chronic for the last 6 months. He will begin to urinate and feel like he cannot completely empty his bladder so he will pull up his pants and move on. Within second of leaving the bathroom he notices post-void dribbling. This occurs daily and intermittently throughout the day. He has noticed dark urine, pelvic cramping. Doesn't drink a lot of water. History of chlamydia in 2019. He has not been sexually active in 2 years. He denies hematuria, penile discharge/pain. He has tried milking the end of his urethra after micturition but has been unsuccessful in extruding the remaninig urine.  Immunizations: -Tetanus: 2022 -Influenza: Declines  -HPV: Completed series  Diet: Fair diet.  Exercise: Regular exercise with [employer]   Eye exam: Completes annually  Dental exam: Completes semi-annually    BP Readings from Last 3 Encounters:  04/02/22 138/80  12/12/20 110/80  07/10/18 122/76      Review of Systems  Constitutional:  Negative for unexpected weight change.  HENT:  Negative for rhinorrhea.   Respiratory:  Negative for cough and shortness of breath.   Cardiovascular:  Negative for chest pain.  Gastrointestinal:  Negative for constipation and diarrhea.  Genitourinary:  Negative for difficulty urinating, dysuria, frequency, hematuria, penile discharge, penile pain, testicular pain and urgency.       Post void dribbling  Musculoskeletal:  Negative for arthralgias and myalgias.  Skin:  Negative for rash.  Allergic/Immunologic: Negative for environmental allergies.  Neurological:  Negative for dizziness and headaches.  Psychiatric/Behavioral:  The patient is not nervous/anxious.          Past Medical History:   Diagnosis Date   ACL tear 07/02/2014   left   Cervical strain 12/12/2020   GAD (generalized anxiety disorder)    Weight loss, unintentional 04/13/2019    Social History   Socioeconomic History   Marital status: Single    Spouse name: Not on file   Number of children: Not on file   Years of education: Not on file   Highest education level: Not on file  Occupational History   Not on file  Tobacco Use   Smoking status: Never   Smokeless tobacco: Never   Tobacco comments:    Vaping  Vaping Use   Vaping Use: Every day  Substance and Sexual Activity   Alcohol use: No   Drug use: No   Sexual activity: Not on file  Other Topics Concern   Not on file  Social History Narrative   Student at [employer].   Single.   Aspires to be a Best boy.    Enjoys playing lacrosse, swimming.    Social Determinants of Health   Financial Resource Strain: Not on file  Food Insecurity: Not on file  Transportation Needs: Not on file  Physical Activity: Not on file  Stress: Not on file  Social Connections: Not on file  Intimate Partner Violence: Not on file    Past Surgical History:  Procedure Laterality Date   KNEE ARTHROSCOPY WITH ANTERIOR CRUCIATE LIGAMENT (ACL) REPAIR Left 07/19/2014   Procedure: KNEE ARTHROSCOPY WITH ANTERIOR CRUCIATE LIGAMENT (ACL) REPAIR AUTOGRAFT;  Surgeon: Dannielle Huh, MD;  Location: [facility];  Service: Orthopedics;  Laterality:  Left;   TEAR DUCT PROBING     TYMPANOSTOMY TUBE PLACEMENT      Family History  Problem Relation Age of Onset   Hypertension Paternal Grandmother    Melanoma Paternal Maurine Simmering' disease Mother    Prostate cancer Paternal Grandfather    Addison's disease Maternal Aunt     No Known Allergies  No current outpatient medications on file prior to visit.   No current facility-administered medications on file prior to visit.    BP 138/80   Pulse 75   Temp 100 F (37.8 C) (Temporal)   Ht 5\' 9"  (1.753 m)    Wt 144 lb (65.3 kg)   SpO2 98%   BMI 21.27 kg/m  Objective:   Physical Exam HENT:     Right Ear: Tympanic membrane and ear canal normal.     Left Ear: Tympanic membrane and ear canal normal.     Nose: Nose normal.     Right Sinus: No maxillary sinus tenderness or frontal sinus tenderness.     Left Sinus: No maxillary sinus tenderness or frontal sinus tenderness.  Eyes:     Conjunctiva/sclera: Conjunctivae normal.  Neck:     Thyroid: No thyromegaly.     Vascular: No carotid bruit.  Cardiovascular:     Rate and Rhythm: Normal rate and regular rhythm.     Heart sounds: Normal heart sounds.  Pulmonary:     Effort: Pulmonary effort is normal.     Breath sounds: Normal breath sounds. No wheezing or rales.  Abdominal:     General: Bowel sounds are normal.     Palpations: Abdomen is soft.     Tenderness: There is no abdominal tenderness.  Musculoskeletal:        General: Normal range of motion.     Cervical back: Neck supple.  Skin:    General: Skin is warm and dry.  Neurological:     Mental Status: He is alert and oriented to person, place, and time.     Cranial Nerves: No cranial nerve deficit.     Deep Tendon Reflexes: Reflexes are normal and symmetric.  Psychiatric:        Mood and Affect: Mood normal.           Assessment & Plan:   Problem List Items Addressed This Visit       Other   GAD (generalized anxiety disorder)    Controlled.  Remain off Lexapro for now. Continue to monitor.       Post-void dribbling    Checking urinalysis, gonorrhea/chlamydia/trichomonas through urine sampling today. Consider referral to urology for further evaluation.  Await urine lab results.      Relevant Orders   POCT Urinalysis Dipstick (Automated)   C. trachomatis/N. gonorrhoeae RNA   Trichomonas vaginalis, RNA   Encounter for annual general medical examination with abnormal findings in adult - Primary    Tetanus and HPV up-to-date, declines influenza vaccine  today.  Encouraged healthy diet and regular exercise.  Exam today stable. Lab results pending.  Follow-up in 1 year for CPE.          Pleas Koch, NP

## 2022-04-02 NOTE — Assessment & Plan Note (Addendum)
Checking urinalysis, gonorrhea/chlamydia/trichomonas through urine sampling today. Consider referral to urology for further evaluation.  Await urine lab results.

## 2022-04-02 NOTE — Assessment & Plan Note (Signed)
Controlled.  Remain off Lexapro for now. Continue to monitor.

## 2022-04-03 LAB — C. TRACHOMATIS/N. GONORRHOEAE RNA
C. trachomatis RNA, TMA: NOT DETECTED
N. gonorrhoeae RNA, TMA: NOT DETECTED

## 2022-04-03 LAB — TRICHOMONAS VAGINALIS, PROBE AMP: Trichomonas vaginalis RNA: NOT DETECTED

## 2022-04-05 LAB — TB SKIN TEST
Induration: <0.5 mm
TB Skin Test: NEGATIVE

## 2022-04-23 ENCOUNTER — Emergency Department (HOSPITAL_BASED_OUTPATIENT_CLINIC_OR_DEPARTMENT_OTHER): Payer: BLUE CROSS/BLUE SHIELD

## 2022-04-23 ENCOUNTER — Other Ambulatory Visit: Payer: Self-pay

## 2022-04-23 ENCOUNTER — Emergency Department (HOSPITAL_BASED_OUTPATIENT_CLINIC_OR_DEPARTMENT_OTHER)
Admission: EM | Admit: 2022-04-23 | Discharge: 2022-04-23 | Disposition: A | Discharge status: Home / Self Care | Payer: BLUE CROSS/BLUE SHIELD | Attending: Emergency Medicine | Admitting: Emergency Medicine

## 2022-04-23 ENCOUNTER — Encounter (HOSPITAL_BASED_OUTPATIENT_CLINIC_OR_DEPARTMENT_OTHER): Payer: Self-pay

## 2022-04-23 DIAGNOSIS — Z1152 Encounter for screening for COVID-19: Secondary | ICD-10-CM | POA: Insufficient documentation

## 2022-04-23 DIAGNOSIS — R051 Acute cough: Secondary | ICD-10-CM | POA: Insufficient documentation

## 2022-04-23 DIAGNOSIS — R072 Precordial pain: Secondary | ICD-10-CM | POA: Diagnosis present

## 2022-04-23 DIAGNOSIS — R112 Nausea with vomiting, unspecified: Secondary | ICD-10-CM | POA: Diagnosis not present

## 2022-04-23 LAB — COMPREHENSIVE METABOLIC PANEL
ALT: 31 U/L (ref 0–44)
AST: 45 U/L — ABNORMAL HIGH (ref 15–41)
Albumin: 5.3 g/dL — ABNORMAL HIGH (ref 3.5–5.0)
Alkaline Phosphatase: 105 U/L (ref 38–126)
Anion gap: 12 (ref 5–15)
BUN: 14 mg/dL (ref 6–20)
CO2: 21 mmol/L — ABNORMAL LOW (ref 22–32)
Calcium: 9.6 mg/dL (ref 8.9–10.3)
Chloride: 100 mmol/L (ref 98–111)
Creatinine, Ser: 1.24 mg/dL (ref 0.61–1.24)
GFR, Estimated: >60 mL/min (ref >60)
Glucose, Bld: 132 mg/dL — ABNORMAL HIGH (ref 70–99)
Potassium: 3.8 mmol/L (ref 3.5–5.1)
Sodium: 133 mmol/L — ABNORMAL LOW (ref 135–145)
Total Bilirubin: 0.5 mg/dL (ref 0.3–1.2)
Total Protein: 8.8 g/dL — ABNORMAL HIGH (ref 6.5–8.1)

## 2022-04-23 LAB — CBC
HCT: 46.8 % (ref 39.0–52.0)
Hemoglobin: 17 g/dL (ref 13.0–17.0)
MCH: 32.3 pg (ref 26.0–34.0)
MCHC: 36.3 g/dL — ABNORMAL HIGH (ref 30.0–36.0)
MCV: 88.8 fL (ref 80.0–100.0)
Platelets: 220 10*3/uL (ref 150–400)
RBC: 5.27 MIL/uL (ref 4.22–5.81)
RDW: 12 % (ref 11.5–15.5)
WBC: 19.2 10*3/uL — ABNORMAL HIGH (ref 4.0–10.5)
nRBC: 0 % (ref 0.0–0.2)

## 2022-04-23 LAB — RESP PANEL BY RT-PCR (RSV, FLU A&B, COVID)  RVPGX2
Influenza A by PCR: NEGATIVE
Influenza B by PCR: NEGATIVE
Resp Syncytial Virus by PCR: NEGATIVE
SARS Coronavirus 2 by RT PCR: NEGATIVE

## 2022-04-23 LAB — TROPONIN I (HIGH SENSITIVITY)
Troponin I (High Sensitivity): 5 ng/L (ref <18)
Troponin I (High Sensitivity): 9 ng/L (ref <18)

## 2022-04-23 LAB — D-DIMER, QUANTITATIVE: D-Dimer, Quant: 1.26 ug/mL-FEU — ABNORMAL HIGH (ref 0.00–0.50)

## 2022-04-23 MED ORDER — ALUM & MAG HYDROXIDE-SIMETH 200-200-20 MG/5ML PO SUSP
30.0000 mL | Freq: Once | ORAL | Status: AC
  Administered 2022-04-23: 30 mL via ORAL
  Filled 2022-04-23: qty 30

## 2022-04-23 MED ORDER — IBUPROFEN 400 MG PO TABS
600.0000 mg | ORAL_TABLET | Freq: Once | ORAL | Status: AC
  Administered 2022-04-23: 600 mg via ORAL
  Filled 2022-04-23: qty 1

## 2022-04-23 MED ORDER — ONDANSETRON HCL 4 MG/2ML IJ SOLN
4.0000 mg | Freq: Once | INTRAMUSCULAR | Status: AC
  Administered 2022-04-23: 4 mg via INTRAVENOUS
  Filled 2022-04-23: qty 2

## 2022-04-23 MED ORDER — SODIUM CHLORIDE 0.9 % IV BOLUS
1000.0000 mL | Freq: Once | INTRAVENOUS | Status: AC
  Administered 2022-04-23: 1000 mL via INTRAVENOUS

## 2022-04-23 MED ORDER — FAMOTIDINE 20 MG PO TABS
20.0000 mg | ORAL_TABLET | Freq: Once | ORAL | Status: AC
  Administered 2022-04-23: 20 mg via ORAL
  Filled 2022-04-23: qty 1

## 2022-04-23 MED ORDER — IOHEXOL 350 MG/ML SOLN
75.0000 mL | Freq: Once | INTRAVENOUS | Status: AC | PRN
  Administered 2022-04-23: 75 mL via INTRAVENOUS

## 2022-04-23 NOTE — ED Triage Notes (Signed)
Pt reports central chest pain and shortness of breath, onset about 2 hours ago. Pain worse with inspiration. He also reports headache and vomiting.

## 2022-04-23 NOTE — ED Notes (Addendum)
During triage this RN attempted to get this patient;'s signature for MSE. He began to have a steady gaze to the left and would not answer any of my questions and follow any of my commands. His pupils were dilated. This episode lasted about one minute. HR was 112 and O2 sats dropped to 87%. He did not respond to sternal rub or pain during this episode. He began to follow my commands and track my finger with his eyes but is still not answering questions or talking. He was able to stand and pivot into wheelchair. Pt taken to room 1 and Dr. Ashok Cordia informed.

## 2022-04-23 NOTE — ED Provider Notes (Signed)
[facility]ENCY DEPARTMENT Provider Note   CSN: [id number] Arrival date & time: 04/23/22  2002     History  Chief Complaint  Patient presents with   Chest Pain    Kristopher White is a 22 y.o. male.  Patient c/o mid chest pain. Indicates returned from trip w family, and generally felt poor, non prod cough, myalgias, used inhaler, and then developed episodes emesis - not bloody or bilious, and since c/o mid chest pain, midline, somewhat pleuritic, worse w cough, and felt sob. No specific known ill contacts. No known covid or flu exposure. No leg pain or  swelling. No hx dvt or pe.   The history is provided by the patient and medical records.  Chest Pain Associated symptoms: cough, nausea, shortness of breath and vomiting   Associated symptoms: no abdominal pain, no fever, no headache, no numbness, no palpitations and no weakness        Home Medications Prior to Admission medications   Not on File      Allergies    Patient has no known allergies.    Review of Systems   Review of Systems  Constitutional:  Negative for chills and fever.  HENT:  Negative for sore throat.   Eyes:  Negative for pain, redness and visual disturbance.  Respiratory:  Positive for cough and shortness of breath.   Cardiovascular:  Positive for chest pain. Negative for palpitations and leg swelling.  Gastrointestinal:  Positive for nausea and vomiting. Negative for abdominal pain.  Genitourinary:  Negative for dysuria and flank pain.  Musculoskeletal:  Positive for myalgias. Negative for neck pain and neck stiffness.  Skin:  Negative for rash.  Neurological:  Negative for weakness, numbness and headaches.  Hematological:  Does not bruise/bleed easily.  Psychiatric/Behavioral:  Negative for confusion.     Physical Exam Updated Vital Signs BP (!) 145/111   Pulse (!) 101   Temp 98.1 F (36.7 C) (Oral)   Resp (!) 21   Ht 1.803 m (5\' 11" )   Wt 65.8 kg   SpO2 100%   BMI 20.22 kg/m   Physical Exam Vitals and nursing note reviewed.  Constitutional:      Appearance: Normal appearance. He is well-developed.  HENT:     Head: Atraumatic.     Nose: Nose normal.     Mouth/Throat:     Mouth: Mucous membranes are moist.     Pharynx: Oropharynx is clear.  Eyes:     General: No scleral icterus.    Conjunctiva/sclera: Conjunctivae normal.     Pupils: Pupils are equal, round, and reactive to light.  Neck:     Trachea: No tracheal deviation.     Comments: No stiffness or rigidity.  Cardiovascular:     Rate and Rhythm: Normal rate and regular rhythm.     Pulses: Normal pulses.     Heart sounds: Normal heart sounds. No murmur heard.    No friction rub. No gallop.  Pulmonary:     Effort: Pulmonary effort is normal. No accessory muscle usage or respiratory distress.     Breath sounds: Normal breath sounds.  Abdominal:     General: Bowel sounds are normal. There is no distension.     Palpations: Abdomen is soft. There is no mass.     Tenderness: There is no abdominal tenderness. There is no guarding.  Genitourinary:    Comments: No cva tenderness. Musculoskeletal:        General: No swelling or tenderness.  Cervical back: Normal range of motion and neck supple. No rigidity.     Right lower leg: No edema.     Left lower leg: No edema.  Skin:    General: Skin is warm and dry.     Findings: No rash.  Neurological:     Mental Status: He is alert.     Comments: Alert, speech clear. Motor/sens grossly intact bil. Steady gait.   Psychiatric:        Mood and Affect: Mood normal.     ED Results / Procedures / Treatments   Labs (all labs ordered are listed, but only abnormal results are displayed) Results for orders placed or performed during the hospital encounter of 04/23/22  Resp panel by RT-PCR (RSV, Flu A&B, Covid) Anterior Nasal Swab   Specimen: Anterior Nasal Swab  Result Value Ref Range   SARS Coronavirus 2 by RT PCR NEGATIVE NEGATIVE   Influenza A by PCR  NEGATIVE NEGATIVE   Influenza B by PCR NEGATIVE NEGATIVE   Resp Syncytial Virus by PCR NEGATIVE NEGATIVE  CBC  Result Value Ref Range   WBC 19.2 (H) 4.0 - 10.5 K/uL   RBC 5.27 4.22 - 5.81 MIL/uL   Hemoglobin 17.0 13.0 - 17.0 g/dL   HCT 46.8 39.0 - 52.0 %   MCV 88.8 80.0 - 100.0 fL   MCH 32.3 26.0 - 34.0 pg   MCHC 36.3 (H) 30.0 - 36.0 g/dL   RDW 12.0 11.5 - 15.5 %   Platelets 220 150 - 400 K/uL   nRBC 0.0 0.0 - 0.2 %  D-dimer, quantitative  Result Value Ref Range   D-Dimer, Quant 1.26 (H) 0.00 - 0.50 ug/mL-FEU  Comprehensive metabolic panel  Result Value Ref Range   Sodium 133 (L) 135 - 145 mmol/L   Potassium 3.8 3.5 - 5.1 mmol/L   Chloride 100 98 - 111 mmol/L   CO2 21 (L) 22 - 32 mmol/L   Glucose, Bld 132 (H) 70 - 99 mg/dL   BUN 14 6 - 20 mg/dL   Creatinine, Ser 1.24 0.61 - 1.24 mg/dL   Calcium 9.6 8.9 - 10.3 mg/dL   Total Protein 8.8 (H) 6.5 - 8.1 g/dL   Albumin 5.3 (H) 3.5 - 5.0 g/dL   AST 45 (H) 15 - 41 U/L   ALT 31 0 - 44 U/L   Alkaline Phosphatase 105 38 - 126 U/L   Total Bilirubin 0.5 0.3 - 1.2 mg/dL   GFR, Estimated >60 >60 mL/min   Anion gap 12 5 - 15  Troponin I (High Sensitivity)  Result Value Ref Range   Troponin I (High Sensitivity) 5 <18 ng/L  Troponin I (High Sensitivity)  Result Value Ref Range   Troponin I (High Sensitivity) 9 <18 ng/L   CT Angio Chest PE W/Cm &/Or Wo Cm  Result Date: 04/23/2022 CLINICAL DATA:  Central chest pain and shortness of breath, initial EXAM: CT ANGIOGRAPHY CHEST WITH CONTRAST TECHNIQUE: Multidetector CT imaging of the chest was performed using the standard protocol during bolus administration of intravenous contrast. Multiplanar CT image reconstructions and MIPs were obtained to evaluate the vascular anatomy. RADIATION DOSE REDUCTION: This exam was performed according to the departmental dose-optimization program which includes automated exposure control, adjustment of the mA and/or kV according to patient size and/or use of  iterative reconstruction technique. CONTRAST:  97mL OMNIPAQUE IOHEXOL 350 MG/ML SOLN COMPARISON:  None Available. FINDINGS: Cardiovascular: Thoracic aorta is within normal limits. No dissection or aneurysmal dilatation is seen. Heart  is not enlarged in size. Pulmonary artery shows a normal branching pattern bilaterally. No filling defect to suggest pulmonary embolism is noted. Mediastinum/Nodes: Thoracic inlet is within normal limits. No hilar or mediastinal adenopathy is noted. The esophagus is within normal limits. Lungs/Pleura: Lungs are well aerated bilaterally. No focal infiltrate or sizable effusion is noted. Upper Abdomen: Visualized upper abdomen is unremarkable. Musculoskeletal: No acute bony abnormality is seen. Review of the MIP images confirms the above findings. IMPRESSION: No acute abnormality noted. Electronically Signed   By: Inez Catalina M.D.   On: 04/23/2022 22:09   DG Chest 2 View  Result Date: 04/23/2022 CLINICAL DATA:  Chest pain, shortness of breath, headache. EXAM: CHEST - 2 VIEW COMPARISON:  None Available. FINDINGS: The heart size and mediastinal contours are within normal limits. Both lungs are clear. No acute osseous abnormality. IMPRESSION: No active cardiopulmonary disease. Electronically Signed   By: Brett Fairy M.D.   On: 04/23/2022 20:52     EKG EKG Interpretation  Date/Time:  Monday April 23 2022 20:21:21 EST Ventricular Rate:  89 PR Interval:  151 QRS Duration: 97 QT Interval:  348 QTC Calculation: 424 R Axis:   109 Text Interpretation: Sinus rhythm Non-specific ST-t changes Confirmed by Lajean Saver [phone]) on 04/23/2022 8:58:02 PM  Radiology CT Angio Chest PE W/Cm &/Or Wo Cm  Result Date: 04/23/2022 CLINICAL DATA:  Central chest pain and shortness of breath, initial EXAM: CT ANGIOGRAPHY CHEST WITH CONTRAST TECHNIQUE: Multidetector CT imaging of the chest was performed using the standard protocol during bolus administration of intravenous contrast.  Multiplanar CT image reconstructions and MIPs were obtained to evaluate the vascular anatomy. RADIATION DOSE REDUCTION: This exam was performed according to the departmental dose-optimization program which includes automated exposure control, adjustment of the mA and/or kV according to patient size and/or use of iterative reconstruction technique. CONTRAST:  68mL OMNIPAQUE IOHEXOL 350 MG/ML SOLN COMPARISON:  None Available. FINDINGS: Cardiovascular: Thoracic aorta is within normal limits. No dissection or aneurysmal dilatation is seen. Heart is not enlarged in size. Pulmonary artery shows a normal branching pattern bilaterally. No filling defect to suggest pulmonary embolism is noted. Mediastinum/Nodes: Thoracic inlet is within normal limits. No hilar or mediastinal adenopathy is noted. The esophagus is within normal limits. Lungs/Pleura: Lungs are well aerated bilaterally. No focal infiltrate or sizable effusion is noted. Upper Abdomen: Visualized upper abdomen is unremarkable. Musculoskeletal: No acute bony abnormality is seen. Review of the MIP images confirms the above findings. IMPRESSION: No acute abnormality noted. Electronically Signed   By: Inez Catalina M.D.   On: 04/23/2022 22:09   DG Chest 2 View  Result Date: 04/23/2022 CLINICAL DATA:  Chest pain, shortness of breath, headache. EXAM: CHEST - 2 VIEW COMPARISON:  None Available. FINDINGS: The heart size and mediastinal contours are within normal limits. Both lungs are clear. No acute osseous abnormality. IMPRESSION: No active cardiopulmonary disease. Electronically Signed   By: Brett Fairy M.D.   On: 04/23/2022 20:52    Procedures Procedures    Medications Ordered in ED Medications  sodium chloride 0.9 % bolus 1,000 mL (0 mLs Intravenous Stopped 04/23/22 2158)  ondansetron (ZOFRAN) injection 4 mg (4 mg Intravenous Given 04/23/22 2050)  iohexol (OMNIPAQUE) 350 MG/ML injection 75 mL (75 mLs Intravenous Contrast Given 04/23/22 2145)  ibuprofen  (ADVIL) tablet 600 mg (600 mg Oral Given 04/23/22 2228)  famotidine (PEPCID) tablet 20 mg (20 mg Oral Given 04/23/22 2228)  alum & mag hydroxide-simeth (MAALOX/MYLANTA) 200-200-20 MG/5ML suspension 30 mL (30  mLs Oral Given 04/23/22 2229)    ED Course/ Medical Decision Making/ A&P                           Medical Decision Making Problems Addressed: Acute cough: acute illness or injury with systemic symptoms Nausea and vomiting in adult: acute illness or injury with systemic symptoms Precordial chest pain: acute illness or injury with systemic symptoms  Amount and/or Complexity of Data Reviewed Independent Historian: parent    Details: hx External Data Reviewed: notes. Labs: ordered. Decision-making details documented in ED Course. Radiology: ordered and independent interpretation performed. Decision-making details documented in ED Course. ECG/medicine tests: ordered and independent interpretation performed. Decision-making details documented in ED Course.  Risk OTC drugs. Prescription drug management. Decision regarding hospitalization.   Iv ns. Continuous pulse ox and cardiac monitoring. Labs ordered/sent. Imaging ordered.   Reviewed nursing notes and prior charts for additional history. External reports reviewed. Additional history from:  Cardiac monitor: sinus rhythm, rate 84  Labs reviewed/interpreted by me - wbc elev (? Related to recent nv, dry heaving, vs other). Pt afebrile. Ddimer is elevated. Given cough/sob w pleuritic symptoms will get cta.   Xrays reviewed/interpreted by me - no pna.   CT reviewed/interpreted by me - no PE.   Pt requests ibuprofen. Ibuprofen po. Will also try pepcid and maalox for symptom relief.   Po fluids.  No recurrent nv. Pt is breathing comfortably. O2 sats 100%.   Abd soft nt. No recurrent nv. No increased wob noted.   Exact etiology earlier symptoms not clear, ?possible viral syndrome.   Pt currently appears stable for d/c, vitals  normal, hr 86, rr 14, pulse ox 100%. No abd pain or tenderness. Earlier cp resolve.d   Return precautions provided.           Final Clinical Impression(s) / ED Diagnoses Final diagnoses:  Nausea and vomiting in adult  Precordial chest pain  Acute cough    Rx / DC Orders ED Discharge Orders     None         Lajean Saver, MD 04/23/22 2259

## 2022-04-23 NOTE — ED Notes (Signed)
To X-ray

## 2022-04-23 NOTE — Discharge Instructions (Addendum)
It was our pleasure to provide your ER care today - we hope that you feel better.  Drink plenty of fluids/stay well hydrated.   Follow up with primary care doctor in the next 2-3 days if symptoms fail to improve/resolve.  Return to ER right awayif worse, new symptoms, high fevers, new/severe pain, worsening chest pain, increased trouble breathing, persistent vomiting, new or severe abdominal pain, or other concern.

## 2022-04-23 NOTE — ED Notes (Signed)
Patient was given some water to take with his medication. Patient was able to tolerate fluids.

## 2022-04-27 ENCOUNTER — Encounter: Payer: Self-pay | Admitting: Neurology

## 2022-04-27 ENCOUNTER — Other Ambulatory Visit: Payer: 59

## 2022-04-27 ENCOUNTER — Ambulatory Visit (INDEPENDENT_AMBULATORY_CARE_PROVIDER_SITE_OTHER): Payer: BLUE CROSS/BLUE SHIELD | Admitting: Primary Care

## 2022-04-27 VITALS — BP 130/78 | HR 75 | Temp 98.0°F | Ht 71.0 in | Wt 148.0 lb

## 2022-04-27 DIAGNOSIS — R748 Abnormal levels of other serum enzymes: Secondary | ICD-10-CM

## 2022-04-27 DIAGNOSIS — R4189 Other symptoms and signs involving cognitive functions and awareness: Secondary | ICD-10-CM | POA: Diagnosis not present

## 2022-04-27 LAB — COMPREHENSIVE METABOLIC PANEL
ALT: 42 U/L (ref 0–53)
AST: 104 U/L — ABNORMAL HIGH (ref 0–37)
Albumin: 4.6 g/dL (ref 3.5–5.2)
Alkaline Phosphatase: 76 U/L (ref 39–117)
BUN: 13 mg/dL (ref 6–23)
CO2: 31 mEq/L (ref 19–32)
Calcium: 9.6 mg/dL (ref 8.4–10.5)
Chloride: 104 mEq/L (ref 96–112)
Creatinine, Ser: 0.94 mg/dL (ref 0.40–1.50)
GFR: 114.5 mL/min (ref >60.00)
Glucose, Bld: 86 mg/dL (ref 70–99)
Potassium: 4.1 mEq/L (ref 3.5–5.1)
Sodium: 141 mEq/L (ref 135–145)
Total Bilirubin: 0.6 mg/dL (ref 0.2–1.2)
Total Protein: 6.8 g/dL (ref 6.0–8.3)

## 2022-04-27 LAB — TSH: TSH: 1.13 u[IU]/mL (ref 0.35–5.50)

## 2022-04-27 LAB — T4, FREE: Free T4: 0.93 ng/dL (ref 0.60–1.60)

## 2022-04-27 NOTE — Progress Notes (Signed)
Subjective:    Patient ID: Kristopher White, male    DOB: 04-28-2000, 22 y.o.   MRN: [id number]  HPI  Kristopher White is a very pleasant 22 y.o. male  has a past medical history of ACL tear (07/02/2014), Cervical strain (12/12/2020), GAD (generalized anxiety disorder), and Weight loss, unintentional (04/13/2019). who presents today for ED follow up.  His girlfriend joins [place] today.  He presented to [facility] ED on 04/23/2022 for recent history of mid chest pain, body aches, productive cough, vomiting.  During his stay in the ED he underwent chest x-ray which was without acute process, CT chest which was negative for pulmonary embolism.  He was treated with Pepcid, Maalox, ibuprofen.  He tested negative for influenza, RSV, COVID-19 infection.  He was diagnosed with possible viral syndrome and discharged home later that evening.  Since his ED visit he's feeling better overall. His headache has improved, his breathing has improved, and his chest pain has resolved. His appetite is still lower, but he is hydrating with water.   He and his girlfriend are concerned as he "spaced out" while in triage, required a sternal rub for which he did not respond. He does experience deja vu type experiences and will break out in sweats and feel clammy followed by anxiousness. Symptoms occur several times daily to every 2 weeks lasting a few minutes. This began about 1.5 years ago. He denies a history of seizure disorder. His girlfriend has not noticed spacing out episodes previously. He does notice a chronic resting tremor to both hands.   He denies anxiety. He does drink a lot of soda and sweet tea.  Review of Systems  Constitutional:  Negative for fever and unexpected weight change.  HENT:  Negative for congestion.   Respiratory:  Negative for cough.   Cardiovascular:  Positive for palpitations. Negative for chest pain.  Gastrointestinal:  Negative for blood in stool.  Neurological:  Negative for  dizziness, seizures and headaches.         Past Medical History:  Diagnosis Date   ACL tear 07/02/2014   left   Cervical strain 12/12/2020   GAD (generalized anxiety disorder)    Weight loss, unintentional 04/13/2019    Social History   Socioeconomic History   Marital status: Single    Spouse name: Not on file   Number of children: Not on file   Years of education: Not on file   Highest education level: Not on file  Occupational History   Not on file  Tobacco Use   Smoking status: Never   Smokeless tobacco: Never   Tobacco comments:    Vaping  Vaping Use   Vaping Use: Every day  Substance and Sexual Activity   Alcohol use: No   Drug use: No   Sexual activity: Not on file  Other Topics Concern   Not on file  Social History Narrative   Student at [employer].   Single.   Aspires to be a Psychologist, sport and exercise.    Enjoys playing lacrosse, swimming.    Social Determinants of Health   Financial Resource Strain: Not on file  Food Insecurity: Not on file  Transportation Needs: Not on file  Physical Activity: Not on file  Stress: Not on file  Social Connections: Not on file  Intimate Partner Violence: Not on file    Past Surgical History:  Procedure Laterality Date   KNEE ARTHROSCOPY WITH ANTERIOR CRUCIATE LIGAMENT (ACL) REPAIR Left 07/19/2014   Procedure: KNEE ARTHROSCOPY  WITH ANTERIOR CRUCIATE LIGAMENT (ACL) REPAIR AUTOGRAFT;  Surgeon: Vickey Huger, MD;  Location: [facility];  Service: Orthopedics;  Laterality: Left;   TEAR DUCT PROBING     TYMPANOSTOMY TUBE PLACEMENT      Family History  Problem Relation Age of Onset   Hypertension Paternal Grandmother    Melanoma Paternal Maurine Simmering' disease Mother    Prostate cancer Paternal Grandfather    Addison's disease Maternal Aunt     No Known Allergies  No current outpatient medications on file prior to visit.   No current facility-administered medications on file prior to visit.    BP 130/78    Pulse 75   Temp 98 F (36.7 C) (Oral)   Ht 5\' 11"  (1.803 m)   Wt 148 lb (67.1 kg)   SpO2 99%   BMI 20.64 kg/m  Objective:   Physical Exam Eyes:     Extraocular Movements: Extraocular movements intact.  Cardiovascular:     Rate and Rhythm: Normal rate and regular rhythm.  Pulmonary:     Effort: Pulmonary effort is normal.     Breath sounds: Normal breath sounds. No wheezing or rales.  Musculoskeletal:     Cervical back: Neck supple.  Skin:    General: Skin is warm and dry.  Neurological:     Mental Status: He is alert and oriented to person, place, and time.     Cranial Nerves: No cranial nerve deficit.     Coordination: Coordination normal.  Psychiatric:        Mood and Affect: Mood normal.           Assessment & Plan:  Unresponsiveness Assessment & Plan: Reviewed recent ED notes, labs, imaging.  Repeat CMP pending. Adding TSH and Free T4, especially given family history and recent symptoms.  Referral placed to neurology given symptoms blank stares, deja vu followed by diaphoresis. He agrees.    Orders: -     Comprehensive metabolic panel -     TSH -     T4, free -     Ambulatory referral to Neurology        Pleas Koch, NP

## 2022-04-27 NOTE — Assessment & Plan Note (Signed)
Reviewed recent ED notes, labs, imaging.  Repeat CMP pending. Adding TSH and Free T4, especially given family history and recent symptoms.  Referral placed to neurology given symptoms blank stares, deja vu followed by diaphoresis. He agrees.

## 2022-04-27 NOTE — Patient Instructions (Signed)
Stop by the lab prior to leaving today. I will notify you of your results once received.   You will either be contacted via phone regarding your referral to neurology, or you may receive a letter on your MyChart portal from our referral team with instructions for scheduling an appointment. Please let us know if you have not been contacted by anyone within two weeks.  It was a pleasure to see you today!

## 2022-04-30 DIAGNOSIS — R7401 Elevation of levels of liver transaminase levels: Secondary | ICD-10-CM

## 2022-05-02 LAB — EPSTEIN-BARR VIRUS VCA ANTIBODY PANEL
EBV NA IgG: 115 U/mL — ABNORMAL HIGH
EBV VCA IgG: 194 U/mL — ABNORMAL HIGH
EBV VCA IgM: <36 U/mL

## 2022-05-02 LAB — HEPATITIS PANEL, ACUTE
Hep A IgM: NONREACTIVE
Hep B C IgM: NONREACTIVE
Hepatitis B Surface Ag: NONREACTIVE
Hepatitis C Ab: NONREACTIVE

## 2022-05-02 LAB — CMV IGM: CMV IgM: <30 AU/mL

## 2022-05-02 LAB — MONONUCLEOSIS SCREEN: Heterophile, Mono Screen: NEGATIVE

## 2022-05-11 ENCOUNTER — Other Ambulatory Visit (INDEPENDENT_AMBULATORY_CARE_PROVIDER_SITE_OTHER): Payer: BLUE CROSS/BLUE SHIELD

## 2022-05-11 DIAGNOSIS — R7401 Elevation of levels of liver transaminase levels: Secondary | ICD-10-CM

## 2022-05-11 LAB — HEPATIC FUNCTION PANEL
ALT: 25 U/L (ref 0–53)
AST: 29 U/L (ref 0–37)
Albumin: 4.7 g/dL (ref 3.5–5.2)
Alkaline Phosphatase: 104 U/L (ref 39–117)
Bilirubin, Direct: 0.2 mg/dL (ref 0.0–0.3)
Total Bilirubin: 0.8 mg/dL (ref 0.2–1.2)
Total Protein: 7.1 g/dL (ref 6.0–8.3)

## 2022-05-29 ENCOUNTER — Ambulatory Visit: Payer: 59 | Admitting: Neurology

## 2023-08-05 DIAGNOSIS — Z1283 Encounter for screening for malignant neoplasm of skin: Secondary | ICD-10-CM

## 2023-08-05 DIAGNOSIS — D229 Melanocytic nevi, unspecified: Secondary | ICD-10-CM

## 2023-09-26 ENCOUNTER — Encounter: Payer: Self-pay | Admitting: Primary Care

## 2023-09-26 ENCOUNTER — Ambulatory Visit (INDEPENDENT_AMBULATORY_CARE_PROVIDER_SITE_OTHER): Payer: Self-pay | Admitting: Primary Care

## 2023-09-26 VITALS — BP 146/82 | HR 94 | Temp 99.3°F | Ht 71.0 in | Wt 170.0 lb

## 2023-09-26 DIAGNOSIS — Z113 Encounter for screening for infections with a predominantly sexual mode of transmission: Secondary | ICD-10-CM

## 2023-09-26 NOTE — Patient Instructions (Signed)
 Stop by the lab prior to leaving today. I will notify you of your results once received.   It was a pleasure to see you today!

## 2023-09-26 NOTE — Assessment & Plan Note (Signed)
 Exam today without evidence of HPV lesions or herpes lesions.  Labs pending today including gonorrhea, chlamydia, trichomonas, HIV, hep C, RPR, HSV. We discussed the importance of condom use to prevent STDs and unwanted pregnancy.

## 2023-09-26 NOTE — Progress Notes (Signed)
 Subjective:    Patient ID: Kristopher White, male    DOB: 14-Jun-2000, 23 y.o.   MRN: [id number]  HPI  Kristopher White is a very pleasant 23 y.o. male who presents today for STI testing.  He is sexually active with multiple partners for the last few months. Some condom use, sometimes not.   Yesterday he noticed 7-8 red "calloused spots" to the top of the shaft of the penis without itching or burning. They have improved today. He's seen those spots before when swimming at the beach. He's recently been swimming at the pool   He denies penile discharge, dysuria, pelvic pain.     Review of Systems  Constitutional:  Negative for fever.  Gastrointestinal:  Negative for abdominal pain.  Genitourinary:  Negative for dysuria, frequency, hematuria, penile discharge, penile pain, penile swelling, scrotal swelling, testicular pain and urgency.  Skin:  Positive for color change.         Past Medical History:  Diagnosis Date   ACL tear 07/02/2014   left   Cervical strain 12/12/2020   GAD (generalized anxiety disorder)    Weight loss, unintentional 04/13/2019    Social History   Socioeconomic History   Marital status: Single    Spouse name: Not on file   Number of children: Not on file   Years of education: Not on file   Highest education level: Not on file  Occupational History   Not on file  Tobacco Use   Smoking status: Never   Smokeless tobacco: Never   Tobacco comments:    Vaping  Vaping Use   Vaping status: Every Day  Substance and Sexual Activity   Alcohol use: No   Drug use: No   Sexual activity: Not on file  Other Topics Concern   Not on file  Social History Narrative   Student at [employer].   Single.   Aspires to be a Best boy.    Enjoys playing lacrosse, swimming.    Social Drivers of Corporate investment banker Strain: Not on file  Food Insecurity: Not on file  Transportation Needs: Not on file  Physical Activity: Not on file  Stress: Not on file  Social  Connections: Not on file  Intimate Partner Violence: Not on file    Past Surgical History:  Procedure Laterality Date   KNEE ARTHROSCOPY WITH ANTERIOR CRUCIATE LIGAMENT (ACL) REPAIR Left 07/19/2014   Procedure: KNEE ARTHROSCOPY WITH ANTERIOR CRUCIATE LIGAMENT (ACL) REPAIR AUTOGRAFT;  Surgeon: Christie Cox, MD;  Location: [facility];  Service: Orthopedics;  Laterality: Left;   TEAR DUCT PROBING     TYMPANOSTOMY TUBE PLACEMENT      Family History  Problem Relation Age of Onset   Hypertension Paternal Grandmother    Melanoma Paternal Karin Ours' disease Mother    Prostate cancer Paternal Grandfather    Addison's disease Maternal Aunt     No Known Allergies  No current outpatient medications on file prior to visit.   No current facility-administered medications on file prior to visit.    BP (!) 146/82   Pulse 94   Temp 99.3 F (37.4 C) (Temporal)   Ht 5\' 11"  (1.803 m)   Wt 170 lb (77.1 kg)   SpO2 99%   BMI 23.71 kg/m  Objective:   Physical Exam Exam conducted with a chaperone present.  Cardiovascular:     Rate and Rhythm: Normal rate and regular rhythm.  Pulmonary:     Effort: Pulmonary  effort is normal.     Breath sounds: Normal breath sounds.  Genitourinary:    Penis: No erythema, discharge, swelling or lesions.      Comments: 5-6 pinpoint red spots, some with scaling to anterior shaft of penis. Musculoskeletal:     Cervical back: Neck supple.  Skin:    General: Skin is warm and dry.  Neurological:     Mental Status: He is alert and oriented to person, place, and time.  Psychiatric:        Mood and Affect: Mood normal.           Assessment & Plan:  Screening examination for STI Assessment & Plan: Exam today without evidence of HPV lesions or herpes lesions.  Labs pending today including gonorrhea, chlamydia, trichomonas, HIV, hep C, RPR, HSV. We discussed the importance of condom use to prevent STDs and unwanted  pregnancy.  Orders: -     C. trachomatis/N. gonorrhoeae RNA -     Trichomonas vaginalis, RNA -     Hepatitis C antibody -     HSV(herpes simplex vrs) 1+2 ab-IgG -     HIV Antibody (routine testing w rflx) -     RPR        Gabriel John, NP

## 2023-09-27 LAB — RPR: RPR Ser Ql: NONREACTIVE

## 2023-09-27 LAB — HSV(HERPES SIMPLEX VRS) I + II AB-IGG
HSV 1 IGG,TYPE SPECIFIC AB: <0.9 {index}
HSV 2 IGG,TYPE SPECIFIC AB: <0.9 {index}

## 2023-09-27 LAB — TRICHOMONAS VAGINALIS, PROBE AMP: Trichomonas vaginalis RNA: NOT DETECTED

## 2023-09-27 LAB — C. TRACHOMATIS/N. GONORRHOEAE RNA
C. trachomatis RNA, TMA: NOT DETECTED
N. gonorrhoeae RNA, TMA: NOT DETECTED

## 2023-09-27 LAB — HEPATITIS C ANTIBODY: Hepatitis C Ab: NONREACTIVE

## 2023-09-27 LAB — HIV ANTIBODY (ROUTINE TESTING W REFLEX): HIV 1&2 Ab, 4th Generation: NONREACTIVE

## 2023-09-30 ENCOUNTER — Ambulatory Visit: Payer: Self-pay | Admitting: Primary Care

## 2023-11-26 ENCOUNTER — Encounter: Payer: Self-pay | Admitting: Dermatology

## 2023-11-26 ENCOUNTER — Ambulatory Visit: Admitting: Dermatology

## 2023-11-26 VITALS — BP 131/77 | HR 72

## 2023-11-26 DIAGNOSIS — L578 Other skin changes due to chronic exposure to nonionizing radiation: Secondary | ICD-10-CM | POA: Diagnosis not present

## 2023-11-26 DIAGNOSIS — Z1283 Encounter for screening for malignant neoplasm of skin: Secondary | ICD-10-CM | POA: Diagnosis not present

## 2023-11-26 DIAGNOSIS — D229 Melanocytic nevi, unspecified: Secondary | ICD-10-CM | POA: Diagnosis not present

## 2023-11-26 DIAGNOSIS — W908XXA Exposure to other nonionizing radiation, initial encounter: Secondary | ICD-10-CM | POA: Diagnosis not present

## 2023-11-26 DIAGNOSIS — L814 Other melanin hyperpigmentation: Secondary | ICD-10-CM | POA: Diagnosis not present

## 2023-11-26 DIAGNOSIS — L821 Other seborrheic keratosis: Secondary | ICD-10-CM

## 2023-11-26 NOTE — Patient Instructions (Addendum)

## 2023-11-26 NOTE — Progress Notes (Signed)
   New Patient Visit   Subjective  Kristopher White is a 23 y.o. male who presents for the following: Total Body Skin Exam (TBSE)  Patient present today for new patient visit for TBSE.The patient denies he  has spots, moles and lesions to be evaluated, some may be new or changing. Patient has previously been treated by a dermatologist.Patient reports he  does not have hx of bx. Patient reports family history of skin cancers (Melanoma-Grandmother and uncle). Patient reports throughout his lifetime has had severe sun exposure. Currently, patient reports if he  has excessive sun exposure, he  does apply sunscreen and/or wears protective coverings.  The following portions of the chart were reviewed this encounter and updated as appropriate: medications, allergies, medical history  Review of Systems:  No other skin or systemic complaints except as noted in HPI or Assessment and Plan.  Objective  Well appearing patient in no apparent distress; mood and affect are within normal limits.  A full examination was performed including scalp, head, eyes, ears, nose, lips, neck, chest, axillae, abdomen, back, buttocks, bilateral upper extremities, bilateral lower extremities, hands, feet, fingers, toes, fingernails, and toenails. All findings within normal limits unless otherwise noted below.   Relevant exam findings are noted in the Assessment and Plan.    Assessment & Plan   LENTIGINES - Benign normal skin lesions - Benign-appearing - Call for any changes  MELANOCYTIC NEVI - Tan-brown and/or pink-flesh-colored symmetric macules and papules - Benign appearing on exam today - Observation - Call clinic for new or changing moles - Recommend daily use of broad spectrum spf 30+ sunscreen to sun-exposed areas.   MILD ACTINIC DAMAGE - Chronic condition, secondary to cumulative UV/sun exposure - diffuse scaly erythematous macules with underlying dyspigmentation - Recommend daily broad spectrum sunscreen  SPF 30+ to sun-exposed areas, reapply every 2 hours as needed.  - Staying in the shade or wearing long sleeves, sun glasses (UVA+UVB protection) and wide brim hats (4-inch brim around the entire circumference of the hat) are also recommended for sun protection.  - Call for new or changing lesions.  SKIN CANCER SCREENING PERFORMED TODAY    Return in about 1 year (around 11/25/2024) for TBSE.  I, Jetta Ager, am acting as scribe for RUFUS CHRISTELLA HOLY, MD.  Documentation: I have reviewed the above documentation for accuracy and completeness, and I agree with the above.  RUFUS CHRISTELLA HOLY, MD
